# Patient Record
Sex: Female | Born: 1985 | Race: Black or African American | Hispanic: No | Marital: Single | Smoking: Never smoker
Health system: Southern US, Community
[De-identification: ages and names within clinical notes are randomized; demographics above are authoritative.]

## PROBLEM LIST (undated history)

## (undated) DIAGNOSIS — G43909 Migraine, unspecified, not intractable, without status migrainosus: Secondary | ICD-10-CM

## (undated) HISTORY — PX: BREAST LUMPECTOMY: SHX2

---

## 2011-03-28 ENCOUNTER — Ambulatory Visit: Payer: Self-pay

## 2011-03-28 ENCOUNTER — Other Ambulatory Visit: Payer: Self-pay | Admitting: Occupational Medicine

## 2011-03-28 DIAGNOSIS — R7611 Nonspecific reaction to tuberculin skin test without active tuberculosis: Secondary | ICD-10-CM

## 2011-05-22 ENCOUNTER — Encounter: Payer: Self-pay | Admitting: Emergency Medicine

## 2011-05-22 ENCOUNTER — Emergency Department (HOSPITAL_COMMUNITY)
Admission: EM | Admit: 2011-05-22 | Discharge: 2011-05-22 | Disposition: A | Discharge status: Home / Self Care | Payer: Medicaid Other | Attending: Emergency Medicine | Admitting: Emergency Medicine

## 2011-05-22 DIAGNOSIS — J45909 Unspecified asthma, uncomplicated: Secondary | ICD-10-CM | POA: Insufficient documentation

## 2011-05-22 DIAGNOSIS — R05 Cough: Secondary | ICD-10-CM | POA: Insufficient documentation

## 2011-05-22 DIAGNOSIS — IMO0001 Reserved for inherently not codable concepts without codable children: Secondary | ICD-10-CM | POA: Insufficient documentation

## 2011-05-22 DIAGNOSIS — R070 Pain in throat: Secondary | ICD-10-CM | POA: Insufficient documentation

## 2011-05-22 DIAGNOSIS — R6883 Chills (without fever): Secondary | ICD-10-CM | POA: Insufficient documentation

## 2011-05-22 DIAGNOSIS — R059 Cough, unspecified: Secondary | ICD-10-CM | POA: Insufficient documentation

## 2011-05-22 DIAGNOSIS — J111 Influenza due to unidentified influenza virus with other respiratory manifestations: Secondary | ICD-10-CM | POA: Insufficient documentation

## 2011-05-22 MED ORDER — HYDROCODONE-ACETAMINOPHEN 7.5-325 MG/15ML PO SOLN: 15.0000 mL | Freq: Three times a day (TID) | ORAL | Status: AC | PRN

## 2011-05-22 MED ORDER — BENZONATATE 100 MG PO CAPS: 200.0000 mg | ORAL_CAPSULE | Freq: Two times a day (BID) | ORAL | Status: AC | PRN

## 2011-05-22 MED ORDER — BENZONATATE 100 MG PO CAPS: 200.0000 mg | ORAL_CAPSULE | Freq: Two times a day (BID) | ORAL | Status: DC | PRN

## 2011-05-22 NOTE — ED Notes (Signed)
PT. REPORTS SORE THROAT AND RIGHT EAR ACHE FOR 1 WEEK ,  ALSO REPORTS PRODUCTIVE COUGH , CHILLS .

## 2011-05-22 NOTE — ED Provider Notes (Signed)
History     CSN: [id number] Arrival date & time: 05/22/2011  3:42 AM   First MD Initiated Contact with Patient 05/22/11 [phone]      Chief Complaint  Patient presents with  . Sore Throat    (Consider location/radiation/quality/duration/timing/severity/associated sxs/prior treatment) HPI Comments: 25 year old female with no significant past medical history other than asthma who presents with a complaint of cough, sore throat, chills and body aches for the last week. She has a daughter with similar symptoms who was diagnosed with the flu by a RapidTest in the pediatrician's office. Symptoms are constant, nothing makes better or worse, not associated with nausea vomiting or diarrhea. She has tried over-the-counter cough medication with minimal improvement.  Patient is a 25 y.o. female presenting with pharyngitis. The history is provided by the patient.  Sore Throat    Past Medical History  Diagnosis Date  . Asthma     Past Surgical History  Procedure Date  . Breast lumpectomy     No family history on file.  History  Substance Use Topics  . Smoking status: Never Smoker   . Smokeless tobacco: Not on file  . Alcohol Use: Yes    OB History    Grav Para Term Preterm Abortions TAB SAB Ect Mult Living                  Review of Systems  All other systems reviewed and are negative.    Allergies  Review of patient's allergies indicates no known allergies.  Home Medications   Current Outpatient Rx  Name Route Sig Dispense Refill  . GUAIFENESIN ER 600 MG PO TB12 Oral Take 1,200 mg by mouth 2 (two) times daily. congestion     . BENZONATATE 100 MG PO CAPS Oral Take 2 capsules (200 mg total) by mouth 2 (two) times daily as needed for cough. 20 capsule 0  . HYDROCODONE-ACETAMINOPHEN 7.5-325 MG/15ML PO SOLN Oral Take 15 mLs by mouth every 8 (eight) hours as needed for pain. 120 mL 0    BP 105/67  Pulse 102  Temp 100.6 F (38.1 C)  Resp 22  SpO2 98%  LMP  05/20/2011  Physical Exam  Constitutional: She appears well-developed and well-nourished. She is cooperative.  Non-toxic appearance. She does not have a sickly appearance. She does not appear ill. No distress.       Vital signs with fever of 100.6  HENT:       Tympanic membrane normal on the right, retracted on the left, oropharynx with erythema but no exudate asymmetry or hypertrophy. Mucous membranes moist.  Eyes: Conjunctivae are normal. Right eye exhibits no discharge. Left eye exhibits no discharge. No scleral icterus.  Neck: Normal range of motion. Neck supple.  Cardiovascular: Normal rate, regular rhythm, normal heart sounds and intact distal pulses.   No murmur heard. Pulmonary/Chest: Effort normal and breath sounds normal. No respiratory distress. She has no wheezes. She has no rales.  Musculoskeletal: Normal range of motion. She exhibits no edema and no tenderness.  Lymphadenopathy:    She has no cervical adenopathy.  Neurological: She is alert. Coordination normal.       Normal gait, coordination, speech  Skin: Skin is warm and dry. No rash noted.    ED Course  Procedures (including critical care time)  Labs Reviewed - No data to display No results found.   1. Flu       MDM  Either with upper respiratory symptoms, temperature 100.6, no findings on pulmonary  exam, daughter with diagnosed flu. Is now 5-7 days of her illness. We'll treat symptomatically with hydrocodone suspension for severe sore throat, Tessalon Perles for cough. Patient informed of indications for followup.  Prescriptions given  #1 Tessalon #2 hydrocodone suspension        Vida Roller, MD 05/22/11 [phone]

## 2011-05-22 NOTE — ED Notes (Signed)
Pt C/o sore throat, with pain ful to swallow, productive cough, yellow phlegm since last 3-4 days. Also fever and chills.

## 2011-08-05 ENCOUNTER — Ambulatory Visit: Payer: Self-pay

## 2011-08-05 ENCOUNTER — Other Ambulatory Visit: Payer: Self-pay | Admitting: Occupational Medicine

## 2011-08-05 DIAGNOSIS — R52 Pain, unspecified: Secondary | ICD-10-CM

## 2012-04-05 ENCOUNTER — Emergency Department (HOSPITAL_COMMUNITY)
Admission: EM | Admit: 2012-04-05 | Discharge: 2012-04-05 | Disposition: A | Discharge status: Home / Self Care | Payer: Worker's Compensation | Attending: Emergency Medicine | Admitting: Emergency Medicine

## 2012-04-05 ENCOUNTER — Encounter (HOSPITAL_COMMUNITY): Payer: Self-pay | Admitting: Emergency Medicine

## 2012-04-05 DIAGNOSIS — S76219A Strain of adductor muscle, fascia and tendon of unspecified thigh, initial encounter: Secondary | ICD-10-CM

## 2012-04-05 DIAGNOSIS — IMO0002 Reserved for concepts with insufficient information to code with codable children: Secondary | ICD-10-CM | POA: Insufficient documentation

## 2012-04-05 DIAGNOSIS — Y93F9 Activity, other caregiving: Secondary | ICD-10-CM | POA: Insufficient documentation

## 2012-04-05 DIAGNOSIS — J45909 Unspecified asthma, uncomplicated: Secondary | ICD-10-CM | POA: Insufficient documentation

## 2012-04-05 DIAGNOSIS — Z8679 Personal history of other diseases of the circulatory system: Secondary | ICD-10-CM | POA: Insufficient documentation

## 2012-04-05 DIAGNOSIS — Y9269 Other specified industrial and construction area as the place of occurrence of the external cause: Secondary | ICD-10-CM | POA: Insufficient documentation

## 2012-04-05 DIAGNOSIS — X503XXA Overexertion from repetitive movements, initial encounter: Secondary | ICD-10-CM | POA: Insufficient documentation

## 2012-04-05 HISTORY — DX: Migraine, unspecified, not intractable, without status migrainosus: G43.909

## 2012-04-05 MED ORDER — NAPROXEN 500 MG PO TABS: 500.0000 mg | ORAL_TABLET | Freq: Two times a day (BID) | ORAL | Status: DC

## 2012-04-05 NOTE — ED Notes (Signed)
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  States that she is having pelvic pain.  Denies groin pain.  Pt ambulates without difficulty.

## 2012-04-05 NOTE — ED Notes (Signed)
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

## 2012-04-05 NOTE — ED Provider Notes (Signed)
Medical screening examination/treatment/procedure(s) were performed by non-physician practitioner and as supervising physician I was immediately available for consultation/collaboration.    Vida Roller, MD 04/05/12 0530

## 2012-04-05 NOTE — ED Provider Notes (Signed)
History     CSN: [id number]  Arrival date & time 04/05/12  0007   First MD Initiated Contact with Patient 04/05/12 0157      Chief Complaint  Patient presents with  . Pelvic pain    HPI  History provided by the patient. Patient is a 26 year old female with history of asthma and migraines who presents with right groin pain and injury. 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 As she went to grab push she felt a pop and soreness in her right groin area. Pain is better at rest she continues to have some soreness with ambulation in the right groin. She denies any numbness or weakness in the foot. Denies any other injury or pain. Denies any history of similar previous injury.    Past Medical History  Diagnosis Date  . Asthma   . Migraine     Past Surgical History  Procedure Date  . Breast lumpectomy     History reviewed. No pertinent family history.  History  Substance Use Topics  . Smoking status: Never Smoker   . Smokeless tobacco: Not on file  . Alcohol Use: Yes     occasion    OB History    Grav Para Term Preterm Abortions TAB SAB Ect Mult Living                  Review of Systems  Musculoskeletal: Negative for back pain.       Right groin pain  Neurological: Negative for weakness and numbness.    Allergies  Latex  Home Medications   Current Outpatient Rx  Name Route Sig Dispense Refill  . ACETAMINOPHEN 500 MG PO TABS Oral Take 500 mg by mouth every 6 (six) hours as needed. For pain      BP 122/74  Temp 98.1 F (36.7 C) (Oral)  Resp 18  SpO2 100%  LMP 03/29/2012  Physical Exam  Nursing note and vitals reviewed. Constitutional: She is oriented to person, place, and time. She appears well-developed and well-nourished. No distress.  HENT:  Head: Normocephalic.  Cardiovascular: Normal rate and regular rhythm.   Pulmonary/Chest: Effort normal and breath sounds  normal.  Musculoskeletal:       Lumbar back: Normal.       Normal full range of motion of right hip. There is normal strength against resistance for flexion, extension abduction and abduction. There is tenderness over the proximal and medial right side. No mass or deformities. Skin normal. Normal distal sensations, pulses and movement and foot. Normal ambulation  Neurological: She is alert and oriented to person, place, and time.  Skin: Skin is warm and dry. No erythema.  Psychiatric: She has a normal mood and affect. Her behavior is normal.    ED Course  Procedures     1. Groin strain       MDM  Patient seen and evaluated. Patient appears comfortable in no acute distress.     Angus Seller, [place] 04/05/12 [phone]

## 2012-07-06 ENCOUNTER — Emergency Department (HOSPITAL_COMMUNITY)
Admission: EM | Admit: 2012-07-06 | Discharge: 2012-07-06 | Disposition: A | Discharge status: Home / Self Care | Payer: BC Managed Care – PPO | Attending: Emergency Medicine | Admitting: Emergency Medicine

## 2012-07-06 ENCOUNTER — Encounter (HOSPITAL_COMMUNITY): Payer: Self-pay | Admitting: Emergency Medicine

## 2012-07-06 DIAGNOSIS — Z8679 Personal history of other diseases of the circulatory system: Secondary | ICD-10-CM | POA: Insufficient documentation

## 2012-07-06 DIAGNOSIS — A5901 Trichomonal vulvovaginitis: Secondary | ICD-10-CM | POA: Insufficient documentation

## 2012-07-06 DIAGNOSIS — R110 Nausea: Secondary | ICD-10-CM | POA: Insufficient documentation

## 2012-07-06 DIAGNOSIS — Z9889 Other specified postprocedural states: Secondary | ICD-10-CM | POA: Insufficient documentation

## 2012-07-06 DIAGNOSIS — O9989 Other specified diseases and conditions complicating pregnancy, childbirth and the puerperium: Secondary | ICD-10-CM | POA: Insufficient documentation

## 2012-07-06 DIAGNOSIS — O2390 Unspecified genitourinary tract infection in pregnancy, unspecified trimester: Secondary | ICD-10-CM | POA: Insufficient documentation

## 2012-07-06 DIAGNOSIS — J45909 Unspecified asthma, uncomplicated: Secondary | ICD-10-CM | POA: Insufficient documentation

## 2012-07-06 DIAGNOSIS — O209 Hemorrhage in early pregnancy, unspecified: Secondary | ICD-10-CM | POA: Insufficient documentation

## 2012-07-06 DIAGNOSIS — N939 Abnormal uterine and vaginal bleeding, unspecified: Secondary | ICD-10-CM

## 2012-07-06 DIAGNOSIS — O039 Complete or unspecified spontaneous abortion without complication: Secondary | ICD-10-CM | POA: Insufficient documentation

## 2012-07-06 DIAGNOSIS — R109 Unspecified abdominal pain: Secondary | ICD-10-CM | POA: Insufficient documentation

## 2012-07-06 DIAGNOSIS — A599 Trichomoniasis, unspecified: Secondary | ICD-10-CM

## 2012-07-06 LAB — URINALYSIS, ROUTINE W REFLEX MICROSCOPIC
Bilirubin Urine: NEGATIVE
Glucose, UA: NEGATIVE mg/dL
Specific Gravity, Urine: 1.014 (ref 1.005–1.030)
pH: 5.5 (ref 5.0–8.0)

## 2012-07-06 LAB — WET PREP, GENITAL: Yeast Wet Prep HPF POC: NONE SEEN

## 2012-07-06 LAB — URINE MICROSCOPIC-ADD ON

## 2012-07-06 MED ORDER — METRONIDAZOLE 500 MG PO TABS
2000.0000 mg | ORAL_TABLET | Freq: Once | ORAL | Status: AC
  Administered 2012-07-06: 2000 mg via ORAL
  Filled 2012-07-06: qty 4

## 2012-07-06 MED ORDER — IBUPROFEN 400 MG PO TABS: 600.0000 mg | ORAL_TABLET | Freq: Once | ORAL | Status: DC

## 2012-07-06 NOTE — ED Provider Notes (Signed)
History     CSN: [id number]  Arrival date & time 07/06/12  1349   First MD Initiated Contact with Patient 07/06/12 1700      No chief complaint on file.   (Consider location/radiation/quality/duration/timing/severity/associated sxs/prior treatment) HPI Comments: 27 y/o F p/w Vaginal bleeding. Spotting this AM. Worsening throughout day. LMP 12/15-16. +home pregnancy test 3 weeks ago. Has not seen OB yet. Has had one full term pregnancy. One abortion.   Patient is a 27 y.o. female presenting with vaginal bleeding. The history is provided by the patient.  Vaginal Bleeding This is a new problem. The current episode started today. The problem occurs constantly. The problem has been gradually worsening. Associated symptoms include abdominal pain (cramping suprapubic pain) and nausea. Pertinent negatives include no change in bowel habit, chest pain, chills, congestion, coughing, fever, headaches, rash or vomiting. Nothing aggravates the symptoms. She has tried nothing for the symptoms.    Past Medical History  Diagnosis Date  . Asthma   . Migraine     Past Surgical History  Procedure Date  . Breast lumpectomy     No family history on file.  History  Substance Use Topics  . Smoking status: Never Smoker   . Smokeless tobacco: Not on file  . Alcohol Use: Yes     Comment: occasion    OB History    Grav Para Term Preterm Abortions TAB SAB Ect Mult Living                  Review of Systems  Constitutional: Negative for fever and chills.  HENT: Negative for congestion and rhinorrhea.   Eyes: Negative for pain and visual disturbance.  Respiratory: Negative for cough and shortness of breath.   Cardiovascular: Negative for chest pain and leg swelling.  Gastrointestinal: Positive for nausea and abdominal pain (cramping suprapubic pain). Negative for vomiting, diarrhea and change in bowel habit.  Genitourinary: Positive for vaginal bleeding. Negative for dysuria, hematuria, flank  pain and difficulty urinating.  Musculoskeletal: Negative for back pain.  Skin: Negative for color change and rash.  Neurological: Negative for dizziness and headaches.  All other systems reviewed and are negative.    Allergies  Latex  Home Medications   Current Outpatient Rx  Name  Route  Sig  Dispense  Refill  . ACETAMINOPHEN 500 MG PO TABS   Oral   Take 500 mg by mouth every 6 (six) hours as needed. For pain         . NAPROXEN 500 MG PO TABS   Oral   Take 1 tablet (500 mg total) by mouth 2 (two) times daily.   30 tablet   0     BP 101/60  Pulse 96  Temp 98.7 F (37.1 C) (Oral)  Resp 16  SpO2 97%  Physical Exam  Nursing note and vitals reviewed. Constitutional: She is oriented to person, place, and time. She appears well-developed and well-nourished. No distress.  HENT:  Head: Normocephalic and atraumatic.  Eyes: Conjunctivae normal are normal. Right eye exhibits no discharge. Left eye exhibits no discharge.  Neck: No tracheal deviation present.  Cardiovascular: Normal rate, regular rhythm, normal heart sounds and intact distal pulses.   Pulmonary/Chest: Effort normal and breath sounds normal. No stridor. No respiratory distress. She has no wheezes. She has no rales.  Abdominal: Soft. Bowel sounds are normal. She exhibits no distension. There is tenderness (mild suprapubic). There is no rebound and no guarding.  Genitourinary: There is no rash, tenderness,  lesion or injury on the right labia. There is no rash, tenderness, lesion or injury on the left labia. Cervix exhibits no motion tenderness and no friability. Right adnexum displays no mass, no tenderness and no fullness. Left adnexum displays no mass, no tenderness and no fullness. There is tenderness (minimal tenderness. No CMT) and bleeding (dark blood in vault. minimal bleeding from os.) around the vagina. No foreign body around the vagina. No signs of injury around the vagina.  Musculoskeletal: She exhibits no  edema and no tenderness.  Neurological: She is alert and oriented to person, place, and time.  Skin: Skin is warm and dry.  Psychiatric: She has a normal mood and affect. Her behavior is normal.    ED Course  Procedures (including critical care time)  Labs Reviewed  URINALYSIS, ROUTINE W REFLEX MICROSCOPIC - Abnormal; Notable for the following:    Color, Urine RED (*)  BIOCHEMICALS MAY BE AFFECTED BY COLOR   APPearance CLOUDY (*)     Hgb urine dipstick LARGE (*)     Leukocytes, UA SMALL (*)     All other components within normal limits  URINE MICROSCOPIC-ADD ON - Abnormal; Notable for the following:    Squamous Epithelial / LPF FEW (*)     All other components within normal limits  WET PREP, GENITAL - Abnormal; Notable for the following:    Trich, Wet Prep FEW (*)     WBC, Wet Prep HPF POC FEW (*)     All other components within normal limits  POCT PREGNANCY, URINE  GC/CHLAMYDIA PROBE AMP   No results found.   1. Spontaneous abortion   2. Trichomonas   3. Vaginal bleeding       MDM  27 y/o F p/w vaginal bleeding. Urine pregnancy negative. Likely fetal demise over past 2-3 weeks. Now with expulsion of contents. Patient states she may have seen some clots vs tissue earlier today. Likely spontaneous abortion. No urinary complaints to suggest UTI. Wet prep with trichomonas. Treated with flagyl 2grams. Likely spontaneous abortion and trichomonas. Doubt Appendicitis, Bowel Obstruction, AAA, UTI, Pyelonephritis, Nephrolithiasis, Pancreatitis, Cholecystitis, Shingles, Perforated Bowel or Ulcer, Diverticulosis/itis, Ischemic Mesentery, Inflammatory Bowel Disease, Strangulated/Incarcerated Hernia, PID, Ectopic Pregnancy, Ovarian Torsion, Tubal Ovarian Abscess, or other abdominal emergency as this would be inconsistent with history and physical, low risk, other diagnosis much more likely.   Dc home, return precautions given, follow up with OB/Gyn in 2 days, patient in agreement with  plan. Discussed importance of discussing STI with sexual partners. To seek evaluation and treatment.        Stevie Kern, MD 07/06/12 1949

## 2012-07-06 NOTE — ED Notes (Signed)
Took a home preg test 3 weeks ago and it was positive  This am when she woke up had vag bleeding is on her 2nd pad today   G3 P1 A 1 L 1

## 2012-07-06 NOTE — ED Provider Notes (Signed)
I saw and evaluated the patient, reviewed the resident's note and I agree with the findings and plan.  Pt with vaginal bleeding, home preg positive, but hcg neg here.   Charles B. Bernette Mayers, MD 07/06/12 1954

## 2012-07-07 LAB — GC/CHLAMYDIA PROBE AMP: CT Probe RNA: NEGATIVE

## 2013-03-03 ENCOUNTER — Encounter (HOSPITAL_COMMUNITY): Payer: Self-pay | Admitting: *Deleted

## 2013-03-03 ENCOUNTER — Emergency Department (HOSPITAL_COMMUNITY)
Admission: EM | Admit: 2013-03-03 | Discharge: 2013-03-03 | Disposition: A | Discharge status: Home / Self Care | Payer: Medicaid Other | Attending: Emergency Medicine | Admitting: Emergency Medicine

## 2013-03-03 DIAGNOSIS — L539 Erythematous condition, unspecified: Secondary | ICD-10-CM | POA: Insufficient documentation

## 2013-03-03 DIAGNOSIS — Z8669 Personal history of other diseases of the nervous system and sense organs: Secondary | ICD-10-CM | POA: Insufficient documentation

## 2013-03-03 DIAGNOSIS — L03011 Cellulitis of right finger: Secondary | ICD-10-CM

## 2013-03-03 DIAGNOSIS — R609 Edema, unspecified: Secondary | ICD-10-CM | POA: Insufficient documentation

## 2013-03-03 DIAGNOSIS — IMO0001 Reserved for inherently not codable concepts without codable children: Secondary | ICD-10-CM | POA: Insufficient documentation

## 2013-03-03 DIAGNOSIS — J45909 Unspecified asthma, uncomplicated: Secondary | ICD-10-CM | POA: Insufficient documentation

## 2013-03-03 DIAGNOSIS — L03019 Cellulitis of unspecified finger: Secondary | ICD-10-CM | POA: Insufficient documentation

## 2013-03-03 MED ORDER — IBUPROFEN 600 MG PO TABS: 600.0000 mg | ORAL_TABLET | Freq: Four times a day (QID) | ORAL | Status: DC | PRN

## 2013-03-03 MED ORDER — CEPHALEXIN 500 MG PO CAPS: 500.0000 mg | ORAL_CAPSULE | Freq: Three times a day (TID) | ORAL | Status: DC

## 2013-03-03 NOTE — ED Notes (Signed)
Pt reports having swelling to right index finger x 3-4 days, denies injury. Mild swelling noted.

## 2013-03-03 NOTE — ED Provider Notes (Signed)
.  Medical screening examination/treatment/procedure(s) were performed by non-physician practitioner and as supervising physician I was immediately available for consultation/collaboration.  Layla Maw Kalel Harty, DO 03/03/13 2258

## 2013-03-03 NOTE — ED Provider Notes (Signed)
CSN: [id number]     Arrival date & time 03/03/13  1732 History  This chart was scribed for non-physician practitioner Jaynie Crumble, PA-C, working with Layla Maw Ward, DO by Dorothey Baseman, ED Scribe. This patient was seen in room TR06C/TR06C and the patient's care was started at 6:41 PM.    Chief Complaint  Patient presents with  . Finger Injury   The history is provided by the patient. No language interpreter was used.   HPI Comments: Erin Coleman is a 27 y.o. female who presents to the Emergency Department complaining of pain to the right index finger with associated redness and swelling onset 4 days ago that has been worsening today. She denies any pain radiation. She denies any potential injury to the area. She reports that she has not taken anything at home to treat the pain.   Past Medical History  Diagnosis Date  . Asthma   . Migraine    Past Surgical History  Procedure Laterality Date  . Breast lumpectomy     History reviewed. No pertinent family history. History  Substance Use Topics  . Smoking status: Never Smoker   . Smokeless tobacco: Not on file  . Alcohol Use: Yes     Comment: occasion   OB History   Grav Para Term Preterm Abortions TAB SAB Ect Mult Living                 Review of Systems  Musculoskeletal: Positive for myalgias.  Skin: Positive for color change. Negative for wound.       Mild redness to the area.  All other systems reviewed and are negative.    Allergies  Latex  Home Medications   Current Outpatient Rx  Name  Route  Sig  Dispense  Refill  . acetaminophen (TYLENOL) 500 MG tablet   Oral   Take 500 mg by mouth every 6 (six) hours as needed. For pain         . naproxen (NAPROSYN) 500 MG tablet   Oral   Take 1 tablet (500 mg total) by mouth 2 (two) times daily.   30 tablet   0    Triage Vitals: BP 97/51  Pulse 79  Temp(Src) 99.1 F (37.3 C) (Oral)  Resp 18  SpO2 98%  LMP 03/03/2013  Physical Exam  Nursing note and  vitals reviewed. Constitutional: She is oriented to person, place, and time. She appears well-developed and well-nourished. No distress.  HENT:  Head: Normocephalic and atraumatic.  Eyes: Conjunctivae are normal.  Neck: Normal range of motion. Neck supple.  Musculoskeletal: Normal range of motion. She exhibits edema.  Tenderness to palpation over lateral cuticle around finger nail. Minimal swelling around the area.  Neurological: She is alert and oriented to person, place, and time.  Skin: Skin is warm and dry. There is erythema.  Mild redness surrounding the area.   Psychiatric: She has a normal mood and affect. Her behavior is normal.    ED Course  Procedures (including critical care time)  DIAGNOSTIC STUDIES: Oxygen Saturation is 98% on room air, normal by my interpretation.    COORDINATION OF CARE: 6:42PM- Advised patient that symptoms may be due to an infection. Will perform an incision and drainage to the area. Advised patient to soak the area in warm soap and water several times a day and apply topical bacitracin. Will discharge patient with Keflex to treat possible infection. Advised patient to follow up with a PCP if there are any new or  worsening symptoms, especially redness, swelling, or drainage. Discussed treatment plan with patient at bedside and patient verbalized agreement.    INCISION AND DRAINAGE Performed by: Jaynie Crumble A Consent: Verbal consent obtained. Risks and benefits: risks, benefits and alternatives were discussed Type: abscess  Body area: paronychia of right index finger  Anesthesia: lonone Incision was made with a scalpel.  Complexity: simple  Drainage: none   Patient tolerance: Patient tolerated the procedure well with no immediate complications.      Labs Review Labs Reviewed - No data to display Imaging Review No results found.  MDM   1. Paronychia of finger, right    Pt with a small early paronychia to the right index  finger. No signs of a felon or tenosynovitis. NO drainage when attempted to I&D, however, her swelling is very minor and finger appears almost normal other than tenderness to the cuticle. Will treat with keflex and follow up.   Filed Vitals:   03/03/13 1741 03/03/13 1912  BP: 97/51 94/67  Pulse: 79 77  Temp: 99.1 F (37.3 C)   TempSrc: Oral   Resp: 18 16  SpO2: 98% 98%   I personally performed the services described in this documentation, which was scribed in my presence. The recorded information has been reviewed and is accurate.     Lottie Mussel, PA-C 03/03/13 2036

## 2013-11-06 ENCOUNTER — Encounter (HOSPITAL_COMMUNITY): Payer: Self-pay | Admitting: Emergency Medicine

## 2013-11-06 ENCOUNTER — Emergency Department (HOSPITAL_COMMUNITY)
Admission: EM | Admit: 2013-11-06 | Discharge: 2013-11-06 | Disposition: A | Discharge status: Home / Self Care | Payer: Medicaid Other | Attending: Emergency Medicine | Admitting: Emergency Medicine

## 2013-11-06 ENCOUNTER — Emergency Department (HOSPITAL_COMMUNITY): Payer: Medicaid Other

## 2013-11-06 DIAGNOSIS — W230XXA Caught, crushed, jammed, or pinched between moving objects, initial encounter: Secondary | ICD-10-CM | POA: Insufficient documentation

## 2013-11-06 DIAGNOSIS — S6990XA Unspecified injury of unspecified wrist, hand and finger(s), initial encounter: Principal | ICD-10-CM | POA: Insufficient documentation

## 2013-11-06 DIAGNOSIS — Z8679 Personal history of other diseases of the circulatory system: Secondary | ICD-10-CM | POA: Insufficient documentation

## 2013-11-06 DIAGNOSIS — Z792 Long term (current) use of antibiotics: Secondary | ICD-10-CM | POA: Insufficient documentation

## 2013-11-06 DIAGNOSIS — S6992XA Unspecified injury of left wrist, hand and finger(s), initial encounter: Secondary | ICD-10-CM

## 2013-11-06 DIAGNOSIS — Y939 Activity, unspecified: Secondary | ICD-10-CM | POA: Insufficient documentation

## 2013-11-06 DIAGNOSIS — Y929 Unspecified place or not applicable: Secondary | ICD-10-CM | POA: Insufficient documentation

## 2013-11-06 DIAGNOSIS — Z91040 Latex allergy status: Secondary | ICD-10-CM | POA: Insufficient documentation

## 2013-11-06 DIAGNOSIS — S6980XA Other specified injuries of unspecified wrist, hand and finger(s), initial encounter: Secondary | ICD-10-CM | POA: Insufficient documentation

## 2013-11-06 DIAGNOSIS — J45909 Unspecified asthma, uncomplicated: Secondary | ICD-10-CM | POA: Insufficient documentation

## 2013-11-06 MED ORDER — IBUPROFEN 400 MG PO TABS
800.0000 mg | ORAL_TABLET | Freq: Once | ORAL | Status: AC
  Administered 2013-11-06: 800 mg via ORAL
  Filled 2013-11-06: qty 2

## 2013-11-06 MED ORDER — IBUPROFEN 800 MG PO TABS: 800.0000 mg | ORAL_TABLET | Freq: Three times a day (TID) | ORAL | Status: AC

## 2013-11-06 NOTE — Discharge Instructions (Signed)
Fingertip Injuries and Amputations °Fingertip injuries are common and often get injured because they are last to escape when pulling your hand out of harm's way. You have amputated (cut off) part of your finger. How this turns out depends largely on how much was amputated. If just the tip is amputated, often the end of the finger will grow back and the finger may return to much the same as it was before the injury.  °If more of the finger is missing, your caregiver has done the best with the tissue remaining to allow you to keep as much finger as is possible. Your caregiver after checking your injury has tried to leave you with a painless fingertip that has durable, feeling skin. If possible, your caregiver has tried to maintain the finger's length and appearance and preserve its fingernail.  °Please read the instructions outlined below and refer to this sheet in the next few weeks. These instructions provide you with general information on caring for yourself. Your caregiver may also give you specific instructions. While your treatment has been done according to the most current medical practices available, unavoidable complications occasionally occur. If you have any problems or questions after discharge, please call your caregiver. °HOME CARE INSTRUCTIONS  °· You may resume normal diet and activities as directed or allowed. °· Keep your hand elevated above the level of your heart. This helps decrease pain and swelling. °· Keep ice packs (or a bag of ice wrapped in a towel) on the injured area for 15-20 minutes, 03-04 times per day, for the first two days. °· Change dressings if necessary or as directed. °· Clean the wound daily or as directed. °· Only take over-the-counter or prescription medicines for pain, discomfort, or fever as directed by your caregiver. °· Keep appointments as directed. °SEEK IMMEDIATE MEDICAL CARE IF: °· You develop redness, swelling, numbness or increasing pain in the wound. °· There is  pus coming from the wound. °· You develop an unexplained oral temperature above 102° F (38.9° C) or as your caregiver suggests. °· There is a foul (bad) smell coming from the wound or dressing. °· There is a breaking open of the wound (edges not staying together) after sutures or staples have been removed. °MAKE SURE YOU:  °· Understand these instructions. °· Will watch your condition. °· Will get help right away if you are not doing well or get worse. °Document Released: 04/23/2005 Document Revised: 08/25/2011 Document Reviewed: 03/22/2008 °[employer]® Patient Information ©2014 [employer]. ° °

## 2013-11-06 NOTE — ED Notes (Signed)
Patient transported to X-ray 

## 2013-11-06 NOTE — ED Provider Notes (Signed)
CSN: [id number]     Arrival date & time 11/06/13  1209 History  This chart was scribed for non-physician practitioner, Fayrene HelperBowie Fedora Knisely  , working with Richardean Canalavid H Yao, MD, by Tana ConchStephen Methvin ED Scribe. This patient was seen in TR09C/TR09C and the patient's care was started at 1:27 PM.    Chief Complaint  Patient presents with  . Hand Injury      The history is provided by the patient. No language interpreter was used.    HPI Comments: Erin Coleman is a 28 y.o. female who presents to the Emergency Department complaining of left hand pain that began when the lid of her friends trash can slammed on her hand. Pt states that she has associated swelling and bruising on her left hand where the lid slammed down. She denies any wrist pain. She reports that she is otherwise healthy.   Past Medical History  Diagnosis Date  . Asthma   . Migraine    Past Surgical History  Procedure Laterality Date  . Breast lumpectomy     History reviewed. No pertinent family history. History  Substance Use Topics  . Smoking status: Never Smoker   . Smokeless tobacco: Not on file  . Alcohol Use: Yes     Comment: occasion   OB History   Grav Para Term Preterm Abortions TAB SAB Ect Mult Living                 Review of Systems  Constitutional: Positive for activity change. Negative for fever and chills.  HENT: Negative for nosebleeds, postnasal drip, rhinorrhea, sinus pressure, sneezing, sore throat and tinnitus.   Cardiovascular: Negative for chest pain.  Gastrointestinal: Negative for abdominal pain.  Musculoskeletal: Positive for arthralgias, joint swelling and myalgias. Negative for back pain, gait problem, neck pain and neck stiffness.       Left hand   Neurological: Negative for dizziness, tremors, syncope, weakness, numbness and headaches.      Allergies  Latex  Home Medications   Prior to Admission medications   Medication Sig Start Date End Date Taking? Authorizing Provider  cephALEXin  (KEFLEX) 500 MG capsule Take 1 capsule (500 mg total) by mouth 3 (three) times daily. 03/03/13   Tatyana A Kirichenko, PA-C  ibuprofen (ADVIL,MOTRIN) 600 MG tablet Take 1 tablet (600 mg total) by mouth every 6 (six) hours as needed for pain. 03/03/13   Tatyana A Kirichenko, PA-C   BP 105/71  Pulse 78  Temp(Src) 99.1 F (37.3 C) (Oral)  Resp 18  SpO2 100%  LMP 11/04/2013 Physical Exam  Nursing note and vitals reviewed. Constitutional: She is oriented to person, place, and time. She appears well-developed.  HENT:  Head: Normocephalic.  Eyes: Conjunctivae and EOM are normal. No scleral icterus.  Neck: Neck supple. No thyromegaly present.  Cardiovascular: Normal rate and regular rhythm.  Exam reveals no gallop and no friction rub.   No murmur heard. Pulses:      Radial pulses are 2+ on the left side.  Pulmonary/Chest: No stridor. She has no wheezes. She has no rales. She exhibits no tenderness.  Abdominal: She exhibits no distension. There is no tenderness. There is no rebound.  Musculoskeletal: Normal range of motion. She exhibits tenderness. She exhibits no edema.       Left wrist: She exhibits tenderness and swelling. She exhibits normal range of motion, no crepitus, no deformity and no laceration.  Left index finger tenderness to middle phalanx. W/o deformity. Middle finger tenderness to the pad  of the finger, mild hematoma noted, no deformity and no break in skin. Some tenderness with movement of DIP>  Left wrist FROM.  Lymphadenopathy:    She has no cervical adenopathy.  Neurological: She is oriented to person, place, and time. She exhibits normal muscle tone. Coordination normal.  Skin: No rash noted. No erythema.  Psychiatric: She has a normal mood and affect. Her behavior is normal.    ED Course  Procedures (including critical care time)  DIAGNOSTIC STUDIES: Oxygen Saturation is 100% on RA, normal by my interpretation.    COORDINATION OF CARE:   1:34 PM-Discussed  treatment plan which includes XRAY results that show her finger is not broken and  a finger splint with pt at bedside and pt agreed to plan.   Labs Review Labs Reviewed - No data to display  Imaging Review Dg Hand Complete Left  11/06/2013   CLINICAL DATA:  Pain post crush injury  EXAM: LEFT HAND - COMPLETE 3+ VIEW  COMPARISON:  08/05/2011  FINDINGS: There is no evidence of fracture or dislocation. There is no evidence of arthropathy or other focal bone abnormality. Soft tissues are unremarkable.  IMPRESSION: Negative.   Electronically Signed   By: Oley Balm M.D.   On: 11/06/2013 13:18     EKG Interpretation None      MDM   Final diagnoses:  Jammed interphalangeal joint of finger of left hand    BP 105/71  Pulse 78  Temp(Src) 99.1 F (37.3 C) (Oral)  Resp 18  SpO2 100%  LMP 11/04/2013  I have reviewed nursing notes and vital signs. I personally reviewed the imaging tests through PACS system  I reviewed available ER/hospitalization records thought the EMR   I personally performed the services described in this documentation, which was scribed in my presence. The recorded information has been reviewed and is accurate.    Fayrene Helper, PA-C 11/06/13 1340

## 2013-11-06 NOTE — ED Notes (Signed)
Pt reports having injury to left hand early this am, had large lid crush her fingers. Having swelling and bruising to fingers.

## 2013-11-06 NOTE — Progress Notes (Signed)
Orthopedic Tech Progress Note Patient Details:  Erin Coleman [birth year] [id number] [employer] Type of Ortho Device: Finger splint Ortho Device/Splint Location: Left Middle finger Ortho Device/Splint Interventions: Application   Asia R Thompson 11/06/2013, 2:04 PM

## 2013-11-07 NOTE — ED Provider Notes (Signed)
Medical screening examination/treatment/procedure(s) were performed by non-physician practitioner and as supervising physician I was immediately available for consultation/collaboration.   EKG Interpretation None        David H Yao, MD 11/07/13 1506 

## 2014-01-16 IMAGING — CR DG WRIST COMPLETE 3+V*R*
2 series · 2 of 2 positions shown · non-contrast
Comparison: April 04, 2013

CLINICAL DATA: Pain post trauma

EXAM:
RIGHT WRIST - COMPLETE 3+ VIEW

[PA]
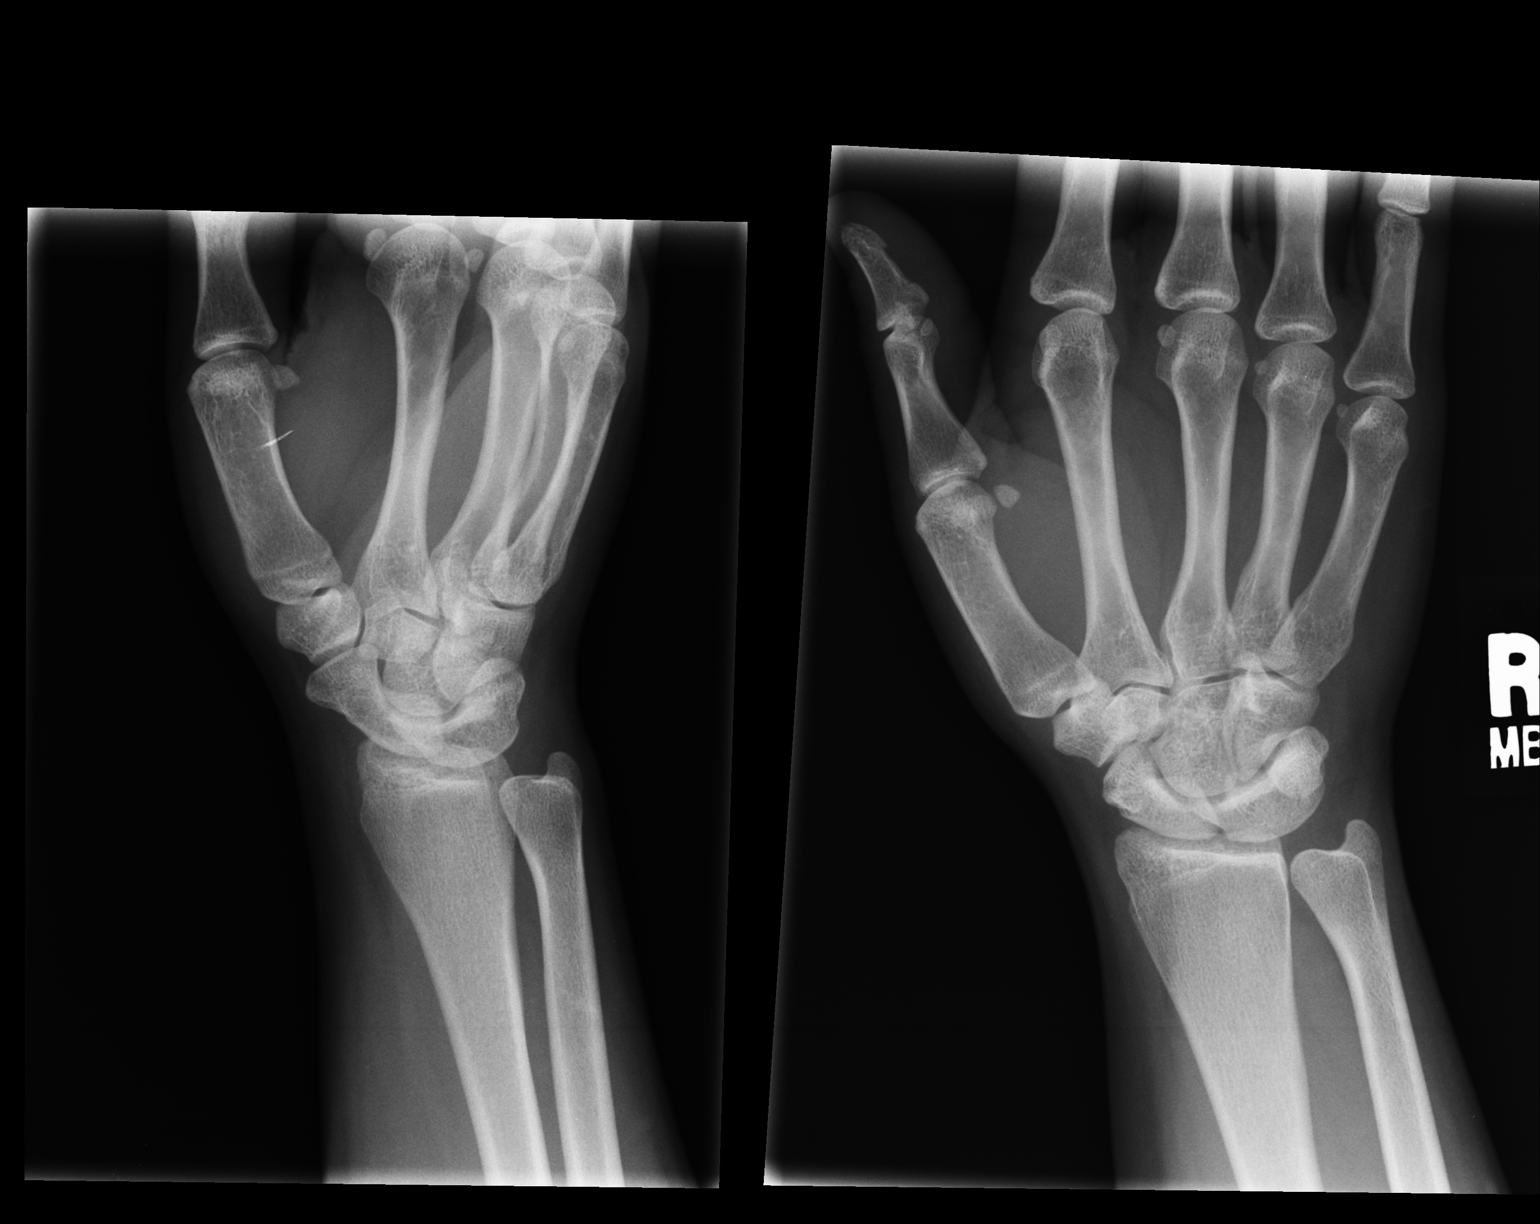

[lateral]
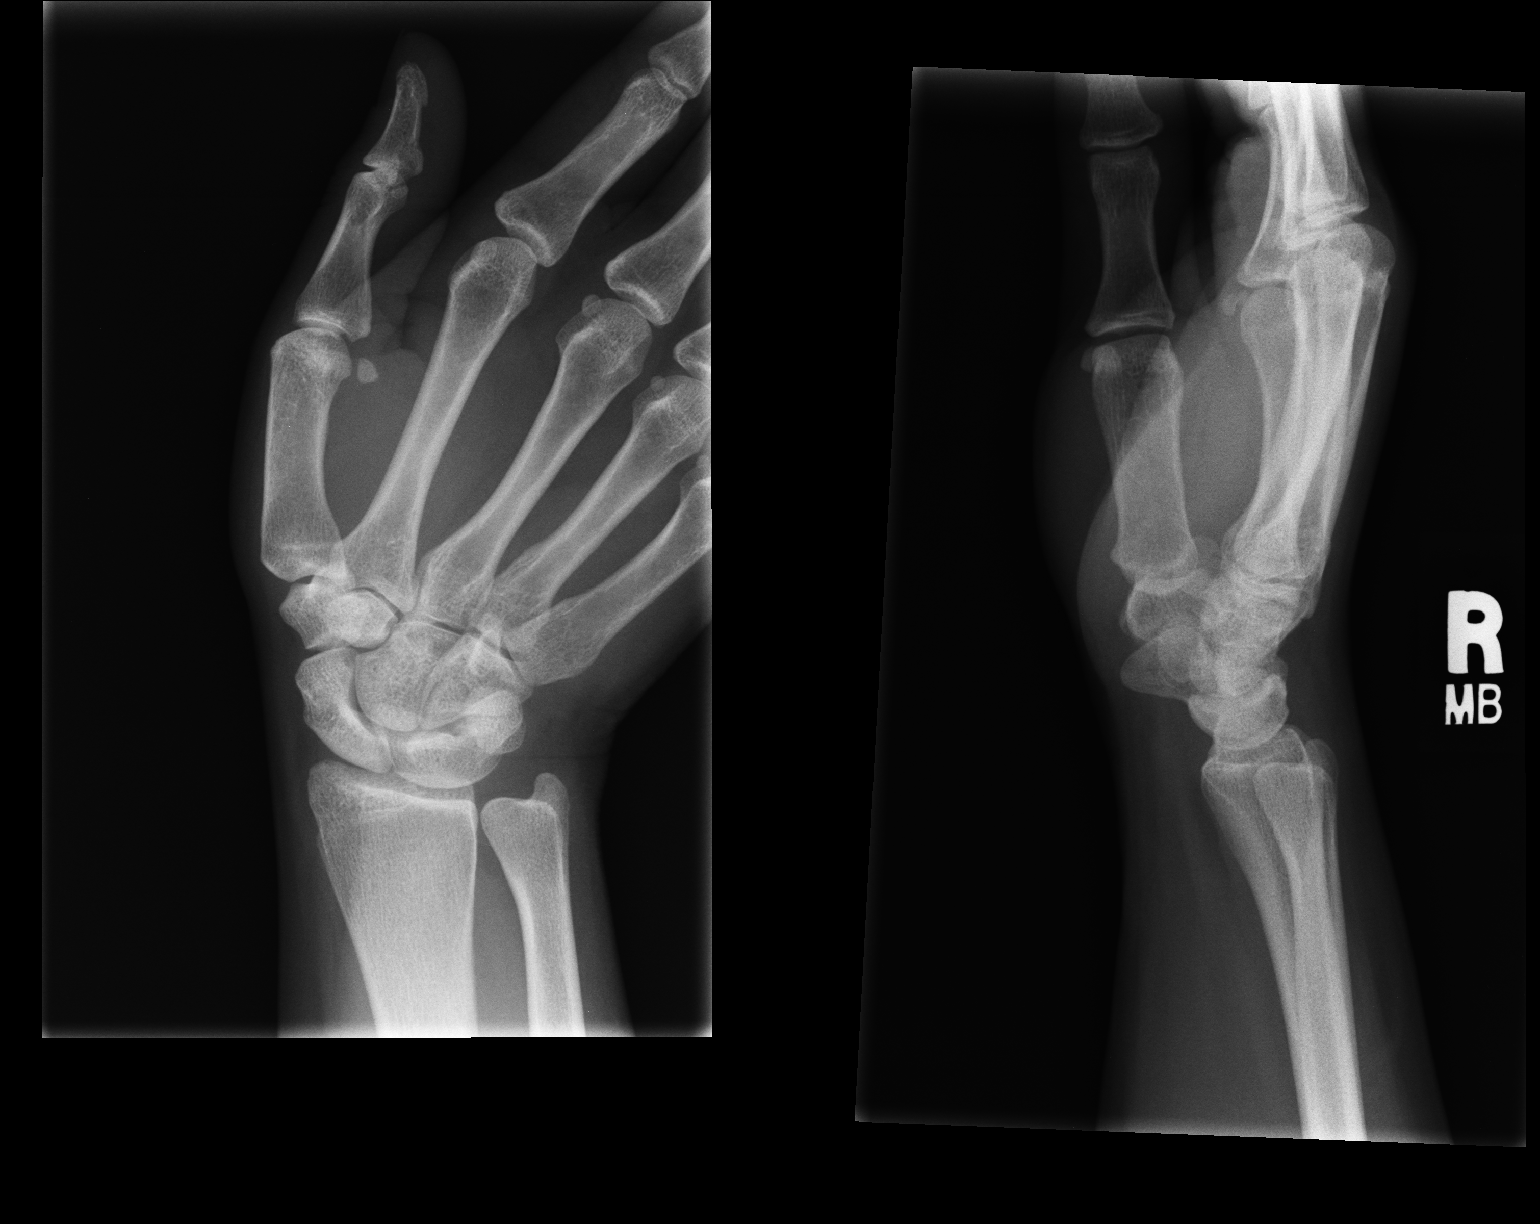

[2 of 2 positions shown; findings below may reference images not displayed]

FINDINGS: Frontal, oblique, lateral, and ulnar deviation scaphoid images were
obtained. There is no apparent fracture or dislocation. Joint spaces
appear intact. There is congenital fusion of the lunate and
triquetrum.
IMPRESSION: Congenital fusion of the lunate and triquetrum. No demonstrable
fracture or dislocation.
